# Patient Record
Sex: Male | Born: 1985 | Hispanic: Yes | Marital: Single
Health system: Southern US, Community
[De-identification: ages and names within clinical notes are randomized; demographics above are authoritative.]

---

## 2021-08-02 ENCOUNTER — Ambulatory Visit: Admission: EM | Admit: 2021-08-02 | Discharge: 2021-08-02 | Payer: Self-pay

## 2021-08-02 ENCOUNTER — Other Ambulatory Visit: Payer: Self-pay

## 2021-08-02 ENCOUNTER — Emergency Department
Admission: EM | Admit: 2021-08-02 | Discharge: 2021-08-02 | Disposition: A | Discharge status: Home / Self Care | Payer: Self-pay | Attending: Emergency Medicine | Admitting: Emergency Medicine

## 2021-08-02 ENCOUNTER — Emergency Department: Payer: Self-pay

## 2021-08-02 DIAGNOSIS — R1032 Left lower quadrant pain: Secondary | ICD-10-CM

## 2021-08-02 DIAGNOSIS — K5732 Diverticulitis of large intestine without perforation or abscess without bleeding: Secondary | ICD-10-CM | POA: Insufficient documentation

## 2021-08-02 DIAGNOSIS — K5792 Diverticulitis of intestine, part unspecified, without perforation or abscess without bleeding: Secondary | ICD-10-CM

## 2021-08-02 LAB — COMPREHENSIVE METABOLIC PANEL
ALT: 30 U/L (ref 0–44)
AST: 21 U/L (ref 15–41)
Albumin: 3.8 g/dL (ref 3.5–5.0)
Alkaline Phosphatase: 58 U/L (ref 38–126)
Anion gap: 10 (ref 5–15)
BUN: 8 mg/dL (ref 6–20)
CO2: 25 mmol/L (ref 22–32)
Calcium: 8.7 mg/dL — ABNORMAL LOW (ref 8.9–10.3)
Chloride: 104 mmol/L (ref 98–111)
Creatinine, Ser: 0.57 mg/dL — ABNORMAL LOW (ref 0.61–1.24)
GFR, Estimated: >60 mL/min (ref >60)
Glucose, Bld: 98 mg/dL (ref 70–99)
Potassium: 3.3 mmol/L — ABNORMAL LOW (ref 3.5–5.1)
Sodium: 139 mmol/L (ref 135–145)
Total Bilirubin: 0.6 mg/dL (ref 0.3–1.2)
Total Protein: 7.6 g/dL (ref 6.5–8.1)

## 2021-08-02 LAB — CBC
HCT: 45.1 % (ref 39.0–52.0)
Hemoglobin: 15.9 g/dL (ref 13.0–17.0)
MCH: 32 pg (ref 26.0–34.0)
MCHC: 35.3 g/dL (ref 30.0–36.0)
MCV: 90.7 fL (ref 80.0–100.0)
Platelets: 315 10*3/uL (ref 150–400)
RBC: 4.97 MIL/uL (ref 4.22–5.81)
RDW: 13.2 % (ref 11.5–15.5)
WBC: 8.6 10*3/uL (ref 4.0–10.5)
nRBC: 0 % (ref 0.0–0.2)

## 2021-08-02 LAB — URINALYSIS, COMPLETE (UACMP) WITH MICROSCOPIC
Bacteria, UA: NONE SEEN
Bilirubin Urine: NEGATIVE
Glucose, UA: NEGATIVE mg/dL
Hgb urine dipstick: NEGATIVE
Ketones, ur: NEGATIVE mg/dL
Leukocytes,Ua: NEGATIVE
Nitrite: NEGATIVE
Protein, ur: NEGATIVE mg/dL
Specific Gravity, Urine: 1.023 (ref 1.005–1.030)
pH: 5 (ref 5.0–8.0)

## 2021-08-02 LAB — LIPASE, BLOOD: Lipase: 30 U/L (ref 11–51)

## 2021-08-02 MED ORDER — NAPROXEN 500 MG PO TABS: 500.0000 mg | ORAL_TABLET | Freq: Two times a day (BID) | ORAL | 0 refills | Status: AC

## 2021-08-02 MED ORDER — METRONIDAZOLE 500 MG PO TABS: 500.0000 mg | ORAL_TABLET | Freq: Three times a day (TID) | ORAL | 0 refills | Status: AC

## 2021-08-02 MED ORDER — CIPROFLOXACIN HCL 500 MG PO TABS: 500.0000 mg | ORAL_TABLET | Freq: Two times a day (BID) | ORAL | 0 refills | Status: AC

## 2021-08-02 MED ORDER — SENNOSIDES-DOCUSATE SODIUM 8.6-50 MG PO TABS: 2.0000 | ORAL_TABLET | Freq: Two times a day (BID) | ORAL | 0 refills | Status: AC

## 2021-08-02 MED ORDER — KETOROLAC TROMETHAMINE 30 MG/ML IJ SOLN
15.0000 mg | INTRAMUSCULAR | Status: AC
  Administered 2021-08-02: 15 mg via INTRAVENOUS
  Filled 2021-08-02: qty 1

## 2021-08-02 MED ORDER — IOHEXOL 350 MG/ML SOLN
100.0000 mL | Freq: Once | INTRAVENOUS | Status: AC | PRN
  Administered 2021-08-02: 100 mL via INTRAVENOUS
  Filled 2021-08-02: qty 100

## 2021-08-02 NOTE — ED Notes (Signed)
See triage note  Presents with abd pain  States pain is mainly on the LLQ  Pain radiates into back  No n/v

## 2021-08-02 NOTE — ED Provider Notes (Signed)
-----------------------------------------   7:13 PM on 08/02/2021 -----------------------------------------  Blood pressure 118/75, pulse 68, temperature 98.3 F (36.8 C), temperature source Oral, resp. rate 17, height 5' 6.14" (1.68 m), weight 103 kg, SpO2 96 %.  Assuming care from Dr. Scotty Court.  In short, Barry Robbins is a 35 y.o. male with a chief complaint of Abdominal Pain .  Refer to the original H&P for additional details.  The current plan of care is to follow-up CT results for abdominal pain and possible diverticulitis.  ----------------------------------------- 8:11 PM on 08/02/2021 ----------------------------------------- CT scan consistent with an uncomplicated diverticulitis, patient well-appearing with minimal pain on reassessment.  He is appropriate for discharge home with antibiotic treatment per Dr. Scotty Court.  Patient also states that he has been dealing with recurrent diverticulitis for multiple years and we will provide with referral to GI.  He was counseled to return to the ED for new worsening symptoms, patient agrees with plan.    Chesley Noon, MD 08/02/21 2012

## 2021-08-02 NOTE — ED Provider Notes (Signed)
[facility] Emergency Department Provider Note  ____________________________________________  Time seen: Approximately 6:12 PM  I have reviewed the triage vital signs and the nursing notes.   HISTORY  Chief Complaint Abdominal Pain  Spanish video interpreter Dariel used throughout encounter  HPI Barry Robbins is a 35 y.o. male with a past history of diverticulitis who comes ED complaining of left lower quadrant abdominal pain for the past 3 days, constant, waxing and waning, radiating to the back.  No vomiting or diarrhea or fever.  Feels like previous diverticulitis.  No history of abdominal surgery.  Rated as severe.    Past medical history of diverticulitis   There are no problems to display for this patient.    Past surgical history negative   Prior to Admission medications   Medication Sig Start Date End Date Taking? Authorizing Provider  ciprofloxacin (CIPRO) 500 MG tablet Take 1 tablet (500 mg total) by mouth 2 (two) times daily. 08/02/21  Yes Sharman Cheek, MD  metroNIDAZOLE (FLAGYL) 500 MG tablet Take 1 tablet (500 mg total) by mouth 3 (three) times daily. 08/02/21  Yes Sharman Cheek, MD  naproxen (NAPROSYN) 500 MG tablet Take 1 tablet (500 mg total) by mouth 2 (two) times daily with a meal. 08/02/21  Yes Sharman Cheek, MD  None   Allergies Patient has no known allergies.   No family history on file.  Social History    Review of Systems  Constitutional:   No fever or chills.  ENT:   No sore throat. No rhinorrhea. Cardiovascular:   No chest pain or syncope. Respiratory:   No dyspnea or cough. Gastrointestinal:   Positive as above for left lower quadrant abdominal pain without vomiting and diarrhea.  Musculoskeletal:   Negative for focal pain or swelling All other systems reviewed and are negative except as documented above in ROS and HPI.  ____________________________________________   PHYSICAL EXAM:  VITAL  SIGNS: ED Triage Vitals  Enc Vitals Group     BP 08/02/21 1222 113/74     Pulse Rate 08/02/21 1222 80     Resp 08/02/21 1222 16     Temp 08/02/21 1222 98.4 F (36.9 C)     Temp Source 08/02/21 1222 Oral     SpO2 08/02/21 1222 99 %     Weight 08/02/21 1223 227 lb 1.2 oz (103 kg)     Height 08/02/21 1223 5' 6.14" (1.68 m)     Head Circumference --      Peak Flow --      Pain Score 08/02/21 1222 8     Pain Loc --      Pain Edu? --      Excl. in GC? --     Vital signs reviewed, nursing assessments reviewed.   Constitutional:   Alert and oriented. Non-toxic appearance. Eyes:   Conjunctivae are normal. EOMI. PERRL. ENT      Head:   Normocephalic and atraumatic.      Nose:   Wearing a mask.      Mouth/Throat:   Wearing a mask.      Neck:   No meningismus. Full ROM. Hematological/Lymphatic/Immunilogical:   No cervical lymphadenopathy. Cardiovascular:   RRR. Symmetric bilateral radial and DP pulses.  No murmurs. Cap refill less than 2 seconds. Respiratory:   Normal respiratory effort without tachypnea/retractions. Breath sounds are clear and equal bilaterally. No wheezes/rales/rhonchi. Gastrointestinal:   Soft with pronounced left lower quadrant tenderness. Non distended. No rebound, rigidity, or guarding. Genitourinary:  deferred Musculoskeletal:   Normal range of motion in all extremities. No joint effusions.  No lower extremity tenderness.  No edema. Neurologic:   Normal speech and language.  Motor grossly intact. No acute focal neurologic deficits are appreciated.  Skin:    Skin is warm, dry and intact. No rash noted.  No petechiae, purpura, or bullae.  ____________________________________________    LABS (pertinent positives/negatives) (all labs ordered are listed, but only abnormal results are displayed) Labs Reviewed  COMPREHENSIVE METABOLIC PANEL - Abnormal; Notable for the following components:      Result Value   Potassium 3.3 (*)    Creatinine, Ser 0.57 (*)     Calcium 8.7 (*)    All other components within normal limits  URINALYSIS, COMPLETE (UACMP) WITH MICROSCOPIC - Abnormal; Notable for the following components:   Color, Urine YELLOW (*)    APPearance CLEAR (*)    All other components within normal limits  LIPASE, BLOOD  CBC   ____________________________________________   EKG    ____________________________________________    RADIOLOGY  No results found.  ____________________________________________   PROCEDURES Procedures  ____________________________________________  DIFFERENTIAL DIAGNOSIS   Diverticulitis, renal colic, abdominal wall strain, constipation, hernia  CLINICAL IMPRESSION / ASSESSMENT AND PLAN / ED COURSE  Medications ordered in the ED: Medications  ketorolac (TORADOL) 30 MG/ML injection 15 mg (has no administration in time range)    Pertinent labs & imaging results that were available during my care of the patient were reviewed by me and considered in my medical decision making (see chart for details).  [name] was evaluated in Emergency Department on 08/02/2021 for the symptoms described in the history of present illness. He was evaluated in the context of the global COVID-19 pandemic, which necessitated consideration that the patient might be at risk for infection with the SARS-CoV-2 virus that causes COVID-19. Institutional protocols and algorithms that pertain to the evaluation of patients at risk for COVID-19 are in a state of rapid change based on information released by regulatory bodies including the CDC and federal and state organizations. These policies and algorithms were followed during the patient's care in the ED.   Patient presents with left lower quadrant abdominal pain with significant tenderness.  No rebound.  Vital signs are normal, labs are normal.  Will obtain CT scan to evaluate for complicated diverticulitis including perforation or abscess.  If reassuring I think he can be  discharged home on stool softeners, oral antibiotics.      ____________________________________________   FINAL CLINICAL IMPRESSION(S) / ED DIAGNOSES    Final diagnoses:  Left lower quadrant abdominal pain  Diverticulitis     ED Discharge Orders          Ordered    naproxen (NAPROSYN) 500 MG tablet  2 times daily with meals        08/02/21 1850    ciprofloxacin (CIPRO) 500 MG tablet  2 times daily        08/02/21 1850    metroNIDAZOLE (FLAGYL) 500 MG tablet  3 times daily        08/02/21 1850            Portions of this note were generated with dragon dictation software. Dictation errors may occur despite best attempts at proofreading.    Sharman Cheek, MD 08/02/21 1850

## 2021-08-02 NOTE — ED Notes (Signed)
Video Spanish interpreter used to discuss plan of care including pain medication and IV insertion, CT scan. Name: Barry Robbins # [id number]

## 2021-08-02 NOTE — ED Triage Notes (Signed)
Pt here with left side abd pain that started last night. Pt states pain is LLQ and radiates to back. Pt denies vomiting but diarrhea and nausea. Pt denies hx of kidney stones. Pt has diverticulitis.

## 2022-01-25 IMAGING — CT CT ABD-PELV W/ CM
2 of 4 series · 16 of 46 positions shown, 18 images · IV contrast (APPLIED)
Comparison: None.

CLINICAL DATA: Left side abdominal pain, left lower quadrant pain.
Diverticulitis, complication suspected

EXAM:
CT ABDOMEN AND PELVIS WITH CONTRAST
TECHNIQUE: Multidetector CT imaging of the abdomen and pelvis was performed
using the standard protocol following bolus administration of
intravenous contrast.
CONTRAST:  100mL OMNIPAQUE IOHEXOL 350 MG/ML SOLN

[Series 2: routine abd/pel with · axial · 0.88mm/px · z∈[-1058,-584]mm · 13 of 103 slices shown, 15 images]
[im 4/103  soft-tissue]
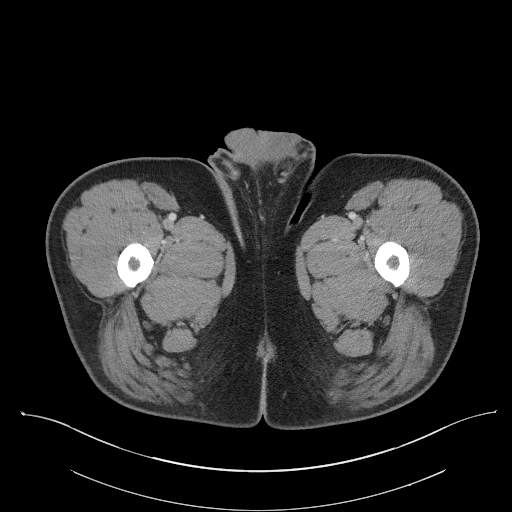
[im 4/103  bone]
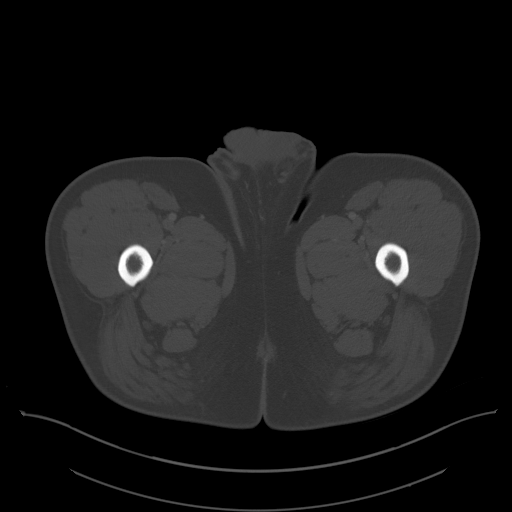
[im 12/103  soft-tissue]
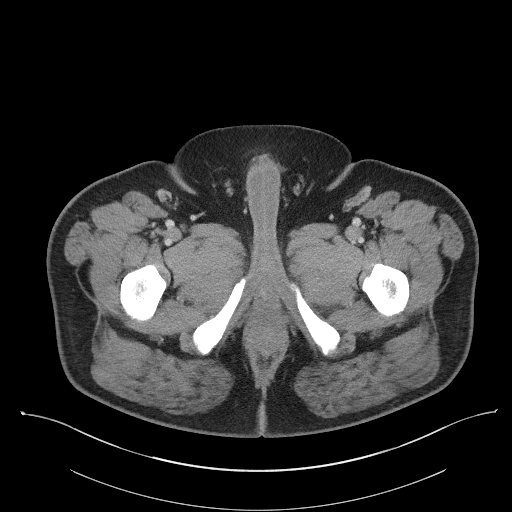
[im 20/103  soft-tissue]
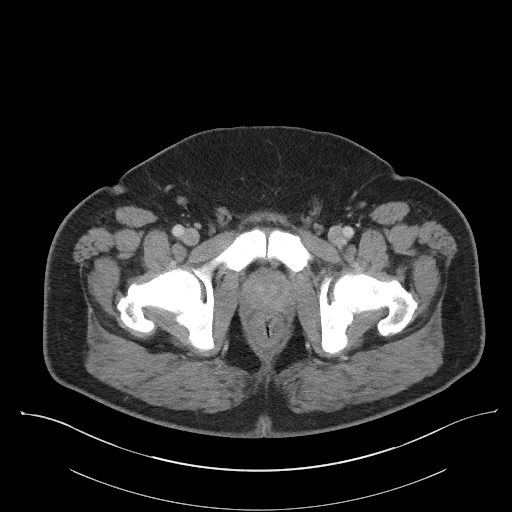
[im 28/103  soft-tissue]
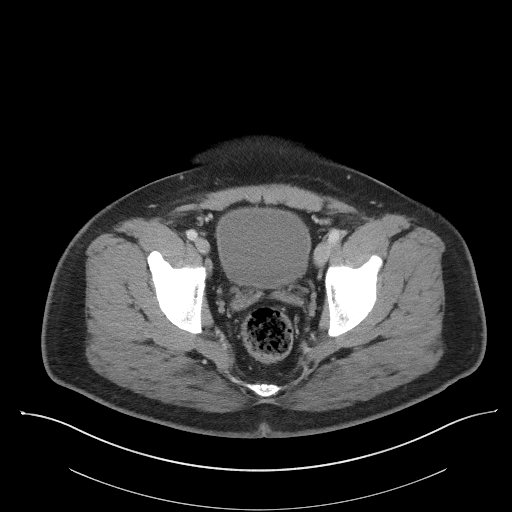
[im 36/103  soft-tissue]
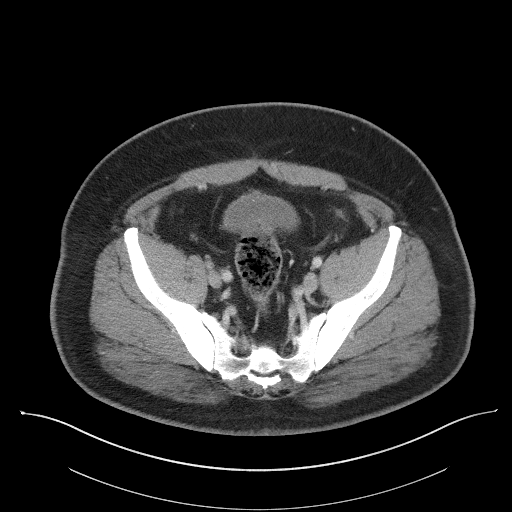
[im 44/103  soft-tissue]
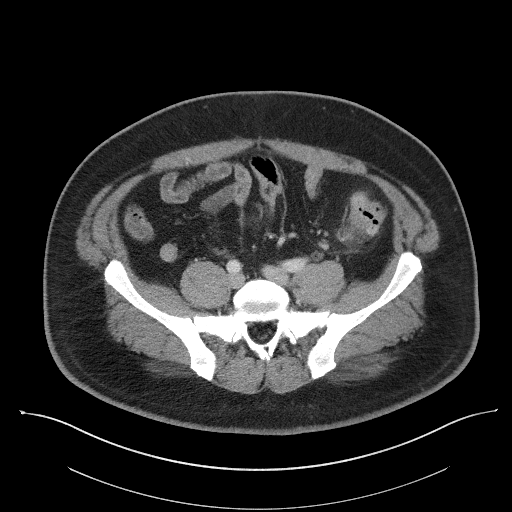
[im 52/103  soft-tissue]
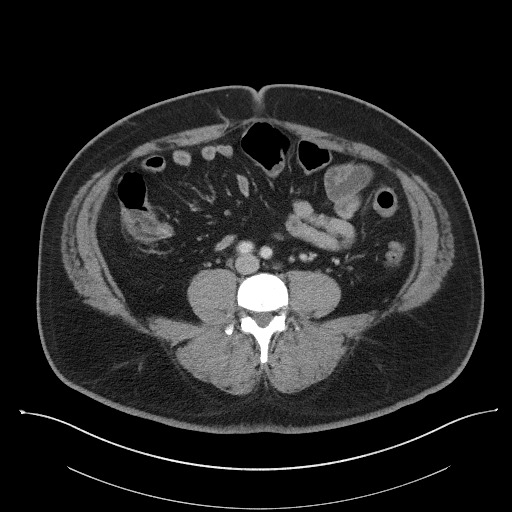
[im 59/103  soft-tissue]
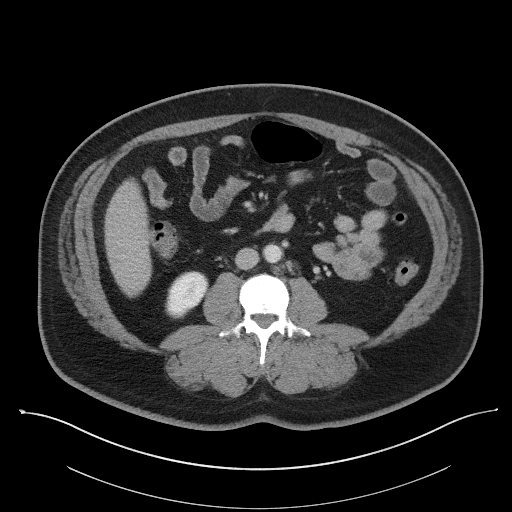
[im 67/103  soft-tissue]
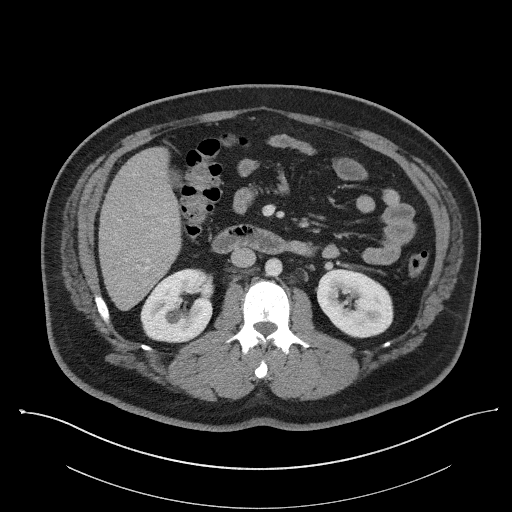
[im 67/103  bone]
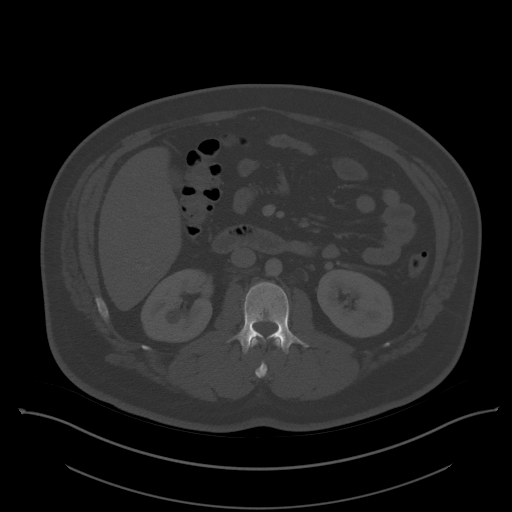
[im 75/103  soft-tissue]
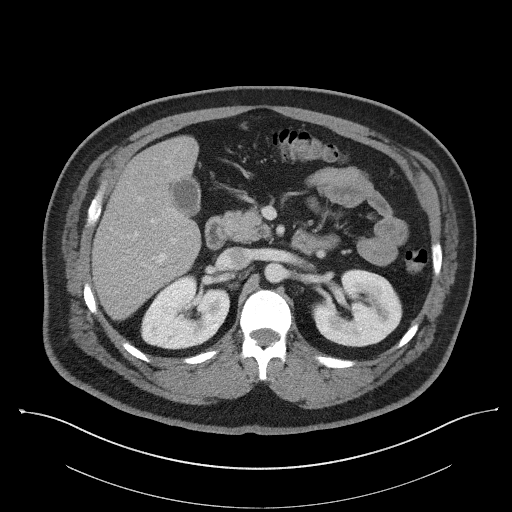
[im 83/103  soft-tissue]
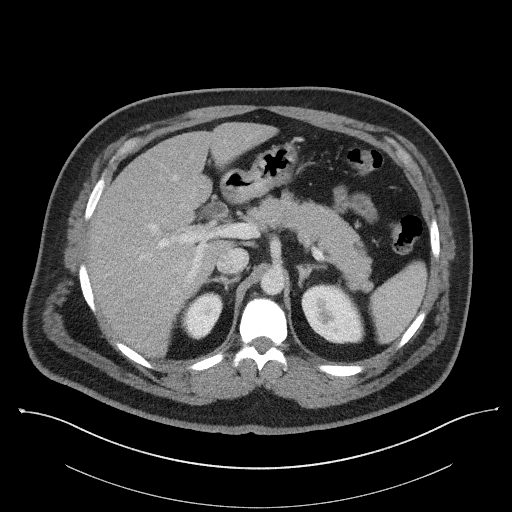
[im 91/103  soft-tissue]
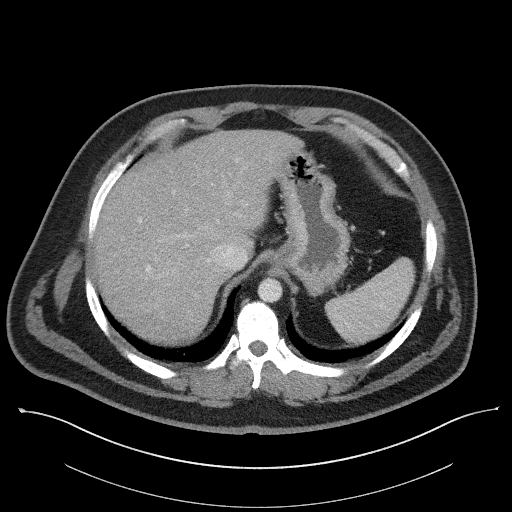
[im 99/103  soft-tissue]
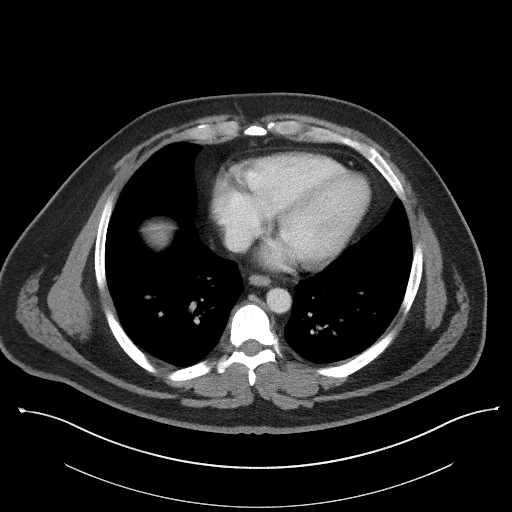

[Series 5: coronal st · coronal · 0.86mm/px · 3 of 99 slices shown]
[im 33/99  soft-tissue]
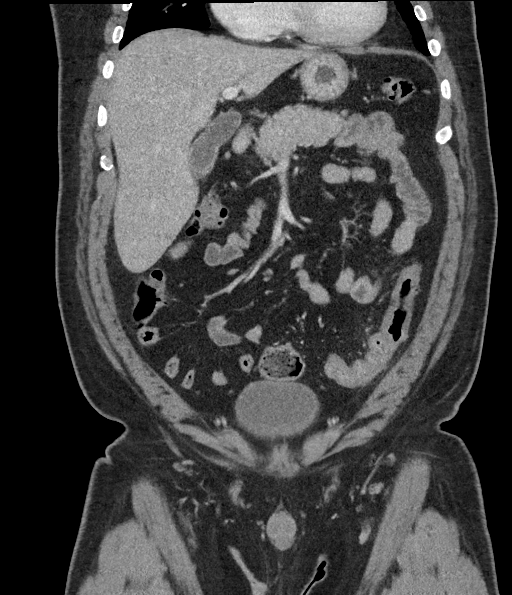
[im 44/99  soft-tissue]
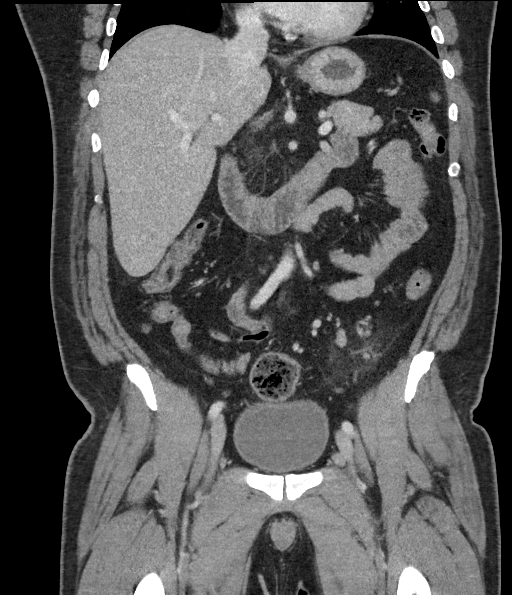
[im 55/99  soft-tissue]
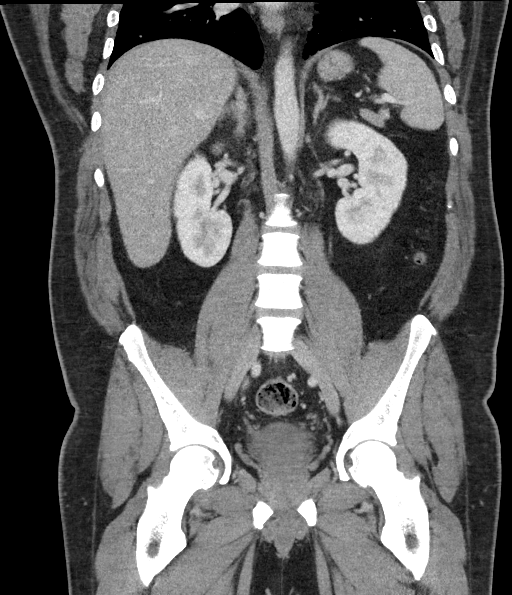

[16 of 46 positions shown; findings below may reference images not displayed]

FINDINGS: Lower chest: No acute abnormality.

Hepatobiliary: Diffuse low-density throughout the liver compatible
with fatty infiltration. No focal abnormality. Gallbladder
unremarkable.

Pancreas: No focal abnormality or ductal dilatation.

Spleen: No focal abnormality.  Normal size.

Adrenals/Urinary Tract: No adrenal abnormality. No focal renal
abnormality. No stones or hydronephrosis. Urinary bladder is
unremarkable.

Stomach/Bowel: Sigmoid diverticulosis. Surrounding
inflammation/stranding adjacent to the proximal sigmoid colon
compatible with active diverticulitis. Small bowel and stomach
decompressed, unremarkable. Appendix normal.

Vascular/Lymphatic: No evidence of aneurysm or adenopathy.

Reproductive: No visible focal abnormality.

Other: No free fluid or free air.

Musculoskeletal: No acute bony abnormality.
IMPRESSION: Sigmoid diverticulosis with inflammatory stranding around the
proximal sigmoid colon compatible with active diverticulitis.

Hepatic steatosis.
